# Patient Record
Sex: Female | Born: 1990 | Hispanic: Yes | Marital: Single | Smoking: Current every day smoker
Health system: Southern US, Community
[De-identification: ages and names within clinical notes are randomized; demographics above are authoritative.]

## PROBLEM LIST (undated history)

## (undated) HISTORY — PX: TONSILLECTOMY: SUR1361

## (undated) HISTORY — PX: WISDOM TOOTH EXTRACTION: SHX21

---

## 2016-12-18 ENCOUNTER — Encounter (HOSPITAL_COMMUNITY): Payer: Self-pay

## 2016-12-18 DIAGNOSIS — Z23 Encounter for immunization: Secondary | ICD-10-CM | POA: Diagnosis not present

## 2016-12-18 DIAGNOSIS — S60131A Contusion of right middle finger with damage to nail, initial encounter: Secondary | ICD-10-CM | POA: Insufficient documentation

## 2016-12-18 DIAGNOSIS — Y929 Unspecified place or not applicable: Secondary | ICD-10-CM | POA: Insufficient documentation

## 2016-12-18 DIAGNOSIS — Y998 Other external cause status: Secondary | ICD-10-CM | POA: Insufficient documentation

## 2016-12-18 DIAGNOSIS — Y939 Activity, unspecified: Secondary | ICD-10-CM | POA: Insufficient documentation

## 2016-12-18 DIAGNOSIS — Z79899 Other long term (current) drug therapy: Secondary | ICD-10-CM | POA: Diagnosis not present

## 2016-12-18 DIAGNOSIS — W230XXA Caught, crushed, jammed, or pinched between moving objects, initial encounter: Secondary | ICD-10-CM | POA: Diagnosis not present

## 2016-12-18 DIAGNOSIS — F17200 Nicotine dependence, unspecified, uncomplicated: Secondary | ICD-10-CM | POA: Diagnosis not present

## 2016-12-18 DIAGNOSIS — S6991XA Unspecified injury of right wrist, hand and finger(s), initial encounter: Secondary | ICD-10-CM | POA: Diagnosis present

## 2016-12-18 NOTE — ED Triage Notes (Signed)
Pt presents with c/o right middle finger injury. Pt reports she slammed her right middle finger in the car door tonight. Pt reports that the nail of her fingernail is displaced and going into her finger. Pt has it wrapped at this time.

## 2016-12-18 NOTE — ED Notes (Signed)
Pt slammed her finger in car door. Not bleeding on assessment

## 2016-12-19 ENCOUNTER — Emergency Department (HOSPITAL_COMMUNITY)
Admission: EM | Admit: 2016-12-19 | Discharge: 2016-12-19 | Disposition: A | Discharge status: Home / Self Care | Payer: 59 | Attending: Emergency Medicine | Admitting: Emergency Medicine

## 2016-12-19 ENCOUNTER — Encounter (HOSPITAL_COMMUNITY): Payer: Self-pay | Admitting: Emergency Medicine

## 2016-12-19 ENCOUNTER — Emergency Department (HOSPITAL_COMMUNITY): Payer: 59

## 2016-12-19 DIAGNOSIS — S60131A Contusion of right middle finger with damage to nail, initial encounter: Secondary | ICD-10-CM

## 2016-12-19 MED ORDER — IBUPROFEN 800 MG PO TABS
800.0000 mg | ORAL_TABLET | Freq: Once | ORAL | Status: AC
  Administered 2016-12-19: 800 mg via ORAL
  Filled 2016-12-19: qty 1

## 2016-12-19 MED ORDER — ACETAMINOPHEN 500 MG PO TABS
1000.0000 mg | ORAL_TABLET | Freq: Once | ORAL | Status: AC
  Administered 2016-12-19: 1000 mg via ORAL
  Filled 2016-12-19: qty 2

## 2016-12-19 MED ORDER — TETANUS-DIPHTH-ACELL PERTUSSIS 5-2.5-18.5 LF-MCG/0.5 IM SUSP
0.5000 mL | Freq: Once | INTRAMUSCULAR | Status: AC
  Administered 2016-12-19: 0.5 mL via INTRAMUSCULAR
  Filled 2016-12-19: qty 0.5

## 2016-12-19 MED ORDER — LIDOCAINE VISCOUS 2 % MT SOLN
15.0000 mL | Freq: Once | OROMUCOSAL | Status: AC
  Administered 2016-12-19: 15 mL via OROMUCOSAL
  Filled 2016-12-19: qty 15

## 2016-12-19 NOTE — ED Provider Notes (Signed)
Pt of Dr Nicanor Alcon. I assisted with finger nail injury.   Examination of right middle finger reveals nail is actually intact but the distal corner is tucked under skin.  Finger tip was soaked in lidocaine. I replaced in correct position using tweezers. Steri strips used to keep secure and bulky dressing applied by RN prior to d/c. Discussed wound care. Tdap updated. Work note given.   ED treatment and discharge plan was discussed with supervising physician who also evaluated the patient and is agreeable with plan.    Liberty Handy, PA-C 12/19/16 0325    Nicanor Alcon, April, MD 12/19/16 [phone]

## 2016-12-19 NOTE — Discharge Instructions (Signed)
You have a contusion of the tip of your finger. Your finger nail was embedded into the corner of your finger skin.   X-ray showed to fracture.  Keep finger clean. Steri strips were used to keep nail in place. If these fall off just make sure the nail is growing normally and does not get under the skin again.   Watch for signs of infection including redness, swelling, warmth, yellow discharge, fevers.

## 2016-12-19 NOTE — ED Provider Notes (Signed)
[facility]-EMERGENCY DEPT Provider Note   CSN: [id number] Arrival date & time: 12/18/16  2053     History   Chief Complaint Chief Complaint  Patient presents with  . Finger Injury    HPI Jean Allison is a 26 y.o. female.  The history is provided by the patient.  Extremity Pain  This is a new problem. The current episode started 6 to 12 hours ago. The problem occurs constantly. The problem has not changed since onset.Pertinent negatives include no chest pain, no abdominal pain, no headaches and no shortness of breath. Nothing aggravates the symptoms. Nothing relieves the symptoms. She has tried nothing for the symptoms. The treatment provided no relief.  closed tip of the right index finger in the car door and tore nail.    History reviewed. No pertinent past medical history.  There are no active problems to display for this patient.   Past Surgical History:  Procedure Laterality Date  . TONSILLECTOMY    . WISDOM TOOTH EXTRACTION      OB History    No data available       Home Medications    Prior to Admission medications   Medication Sig Start Date End Date Taking? Authorizing Provider  albuterol (PROVENTIL HFA;VENTOLIN HFA) 108 (90 Base) MCG/ACT inhaler Inhale 2 puffs into the lungs every 6 (six) hours as needed for wheezing or shortness of breath.   Yes [provider]  ARIPiprazole (ABILIFY) 5 MG tablet Take 5 mg by mouth daily.   Yes [provider]  gabapentin (NEURONTIN) 300 MG capsule Take 300 mg by mouth 2 (two) times daily. Pt takes bid and then will take as needed up to 4 times a day. For Anxiety   Yes [provider]  hydroxypropyl methylcellulose / hypromellose (ISOPTO TEARS / GONIOVISC) 2.5 % ophthalmic solution Place 1 drop into both eyes 3 (three) times daily as needed for dry eyes.   Yes [provider]  lamoTRIgine (LAMICTAL) 100 MG tablet Take 100 mg by mouth daily.   Yes [provider]    Family  History History reviewed. No pertinent family history.  Social History Social History  Substance Use Topics  . Smoking status: Current Every Day Smoker  . Smokeless tobacco: Never Used  . Alcohol use Yes     Allergies   Patient has no known allergies.   Review of Systems Review of Systems  Respiratory: Negative for shortness of breath.   Cardiovascular: Negative for chest pain.  Gastrointestinal: Negative for abdominal pain.  Musculoskeletal: Positive for arthralgias. Negative for back pain and neck stiffness.  Skin: Negative for color change and wound.  Neurological: Negative for headaches.  All other systems reviewed and are negative.    Physical Exam Updated Vital Signs BP (!) 92/56 (BP Location: Left Arm)   Pulse 83   Temp 98.4 F (36.9 C) (Oral)   Resp 16   Ht 5' (1.524 m)   Wt 45.4 kg (100 lb 1.6 oz)   LMP 12/18/2016 (Approximate)   SpO2 100%   BMI 19.55 kg/m   Physical Exam  Constitutional: She is oriented to person, place, and time. She appears well-developed and well-nourished. No distress.  HENT:  Head: Normocephalic and atraumatic.  Nose: Nose normal.  Eyes: Conjunctivae and EOM are normal.  Neck: Normal range of motion. Neck supple.  Cardiovascular: Normal rate, regular rhythm, normal heart sounds and intact distal pulses.   Pulmonary/Chest: Effort normal and breath sounds normal. She has no wheezes. She  has no rales.  Abdominal: Soft. Bowel sounds are normal. She exhibits no mass. There is no tenderness. There is no rebound and no guarding.  Musculoskeletal: She exhibits no deformity.       Right hand: Normal sensation noted. Normal strength noted.  Neurological: She is alert and oriented to person, place, and time.  Skin: Skin is warm and dry. Capillary refill takes less than 2 seconds.  Psychiatric: She has a normal mood and affect.     ED Treatments / Results   Vitals:   12/18/16 2123  BP: (!) 92/56  Pulse: 83  Resp: 16  Temp: 98.4 F  (36.9 C)  SpO2: 100%    Radiology Dg Hand Complete Right  Result Date: 12/19/2016 CLINICAL DATA:  Right hand pain after injury, closed hand in car door. EXAM: RIGHT HAND - COMPLETE 3+ VIEW COMPARISON:  None. FINDINGS: There is no evidence of fracture or dislocation. There is no evidence of arthropathy or other focal bone abnormality. Possible soft tissue injury to the distal third digit, no radiopaque foreign bodies. IMPRESSION: Possible soft tissue injury to the distal third digit. No osseous abnormality. Electronically Signed   By: Rubye Oaks M.D.   On: 12/19/2016 01:50    Procedures Procedures (including critical care time)  Medications Ordered in ED Medications  lidocaine (XYLOCAINE) 2 % viscous mouth solution 15 mL (not administered)  acetaminophen (TYLENOL) tablet 1,000 mg (1,000 mg Oral Given 12/19/16 0128)  ibuprofen (ADVIL,MOTRIN) tablet 800 mg (800 mg Oral Given 12/19/16 0128)  Tdap (BOOSTRIX) injection 0.5 mL (0.5 mLs Intramuscular Given 12/19/16 0128)     Piece of nail lifted off by claudia  Final Clinical Impressions(s) / ED Diagnoses  Keep clean and dry and covered.  Tetanus updated.   The patient is very well appearing and has been observed in the ED.  Strict return precautions given for  intractable rash, swelling or the lips tongue or floor of the mouth, chest pain, dyspnea on exertion, new weakness or numbness changes in vision or speech,  Inability to tolerate liquids or food, fevers > 101, rashes on the skin, changes in voice cough, altered mental status or any concerns. No signs of systemic illness or infection. The patient is nontoxic-appearing on exam and vital signs are within normal limits.    I have reviewed the triage vital signs and the nursing notes. Pertinent labs &imaging results that were available during my care of the patient were reviewed by me and considered in my medical decision making (see chart for details).  After history, exam, and  medical workup I feel the patient has been appropriately medically screened and is safe for discharge home. Pertinent diagnoses were discussed with the patient. Patient was given return precautions.    Tysen Roesler, MD 12/19/16 5883

## 2019-01-23 IMAGING — CR DG HAND COMPLETE 3+V*R*
3 series · 3 of 3 positions shown · non-contrast
Comparison: None.

CLINICAL DATA: Right hand pain after injury, closed hand in car
door.

EXAM:
RIGHT HAND - COMPLETE 3+ VIEW

[x hand pa right]
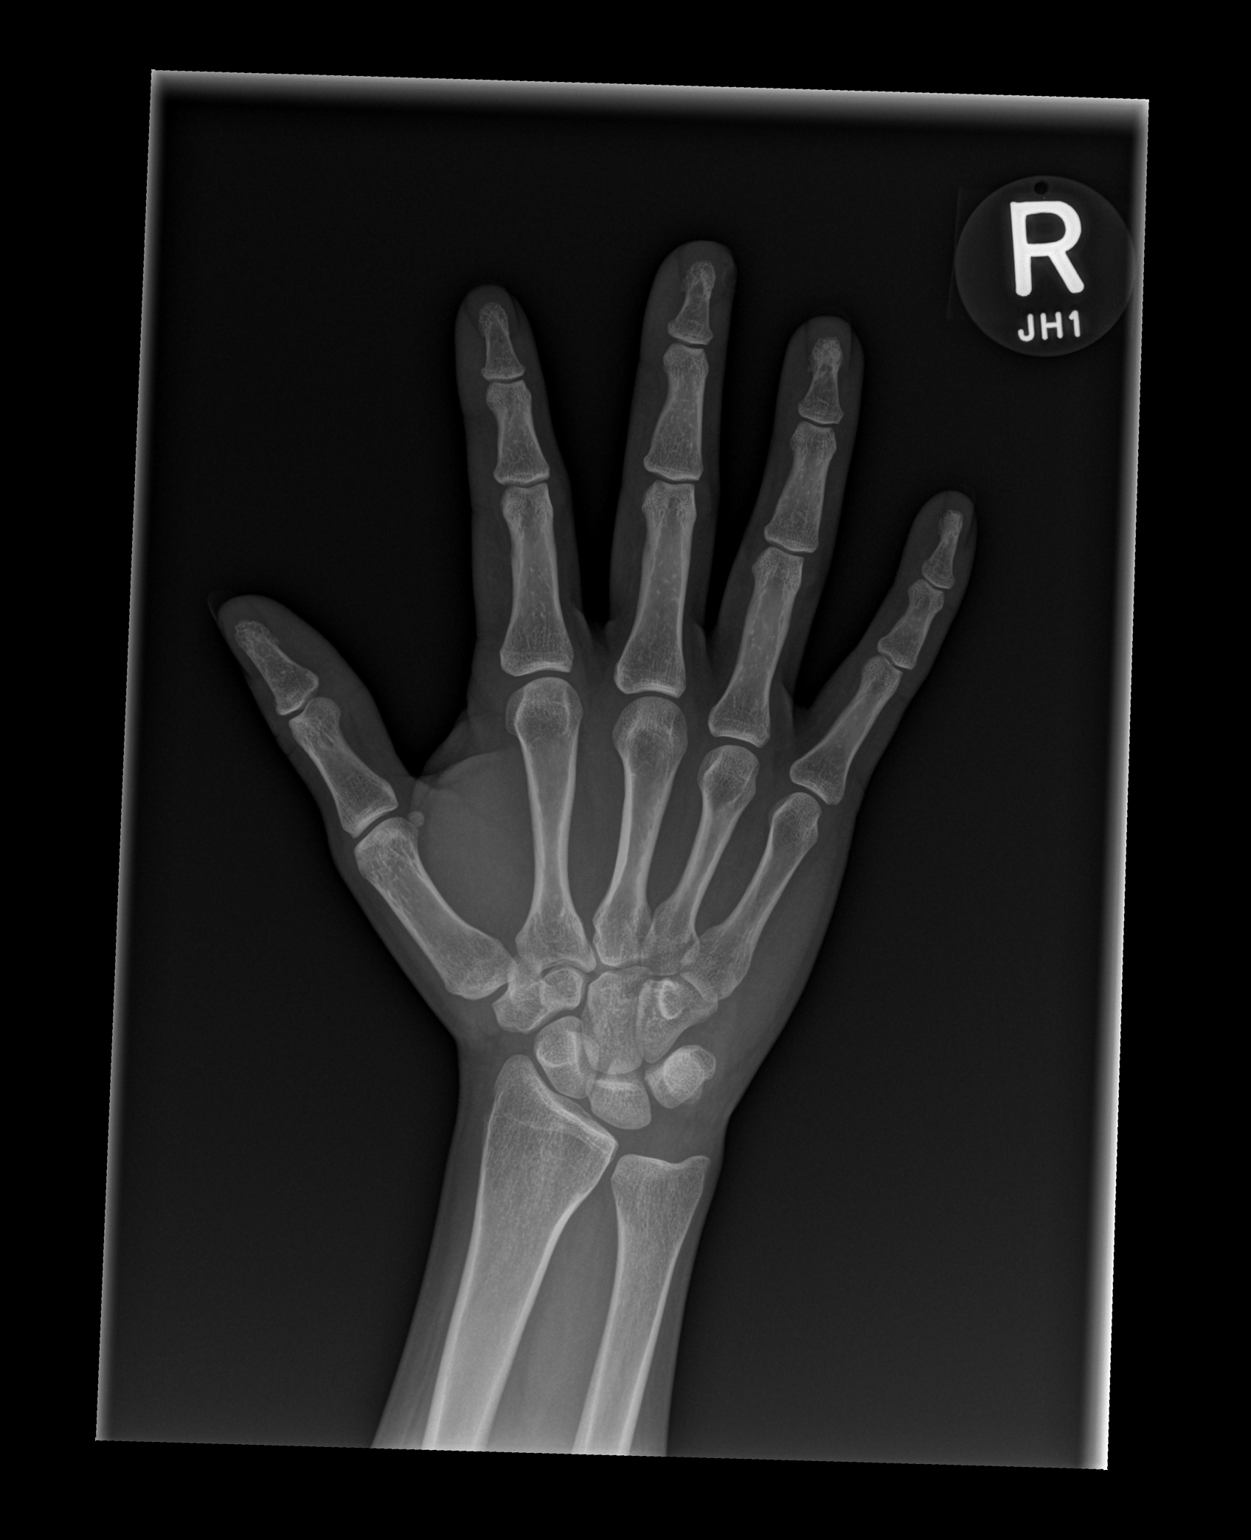

[x hand obl right]
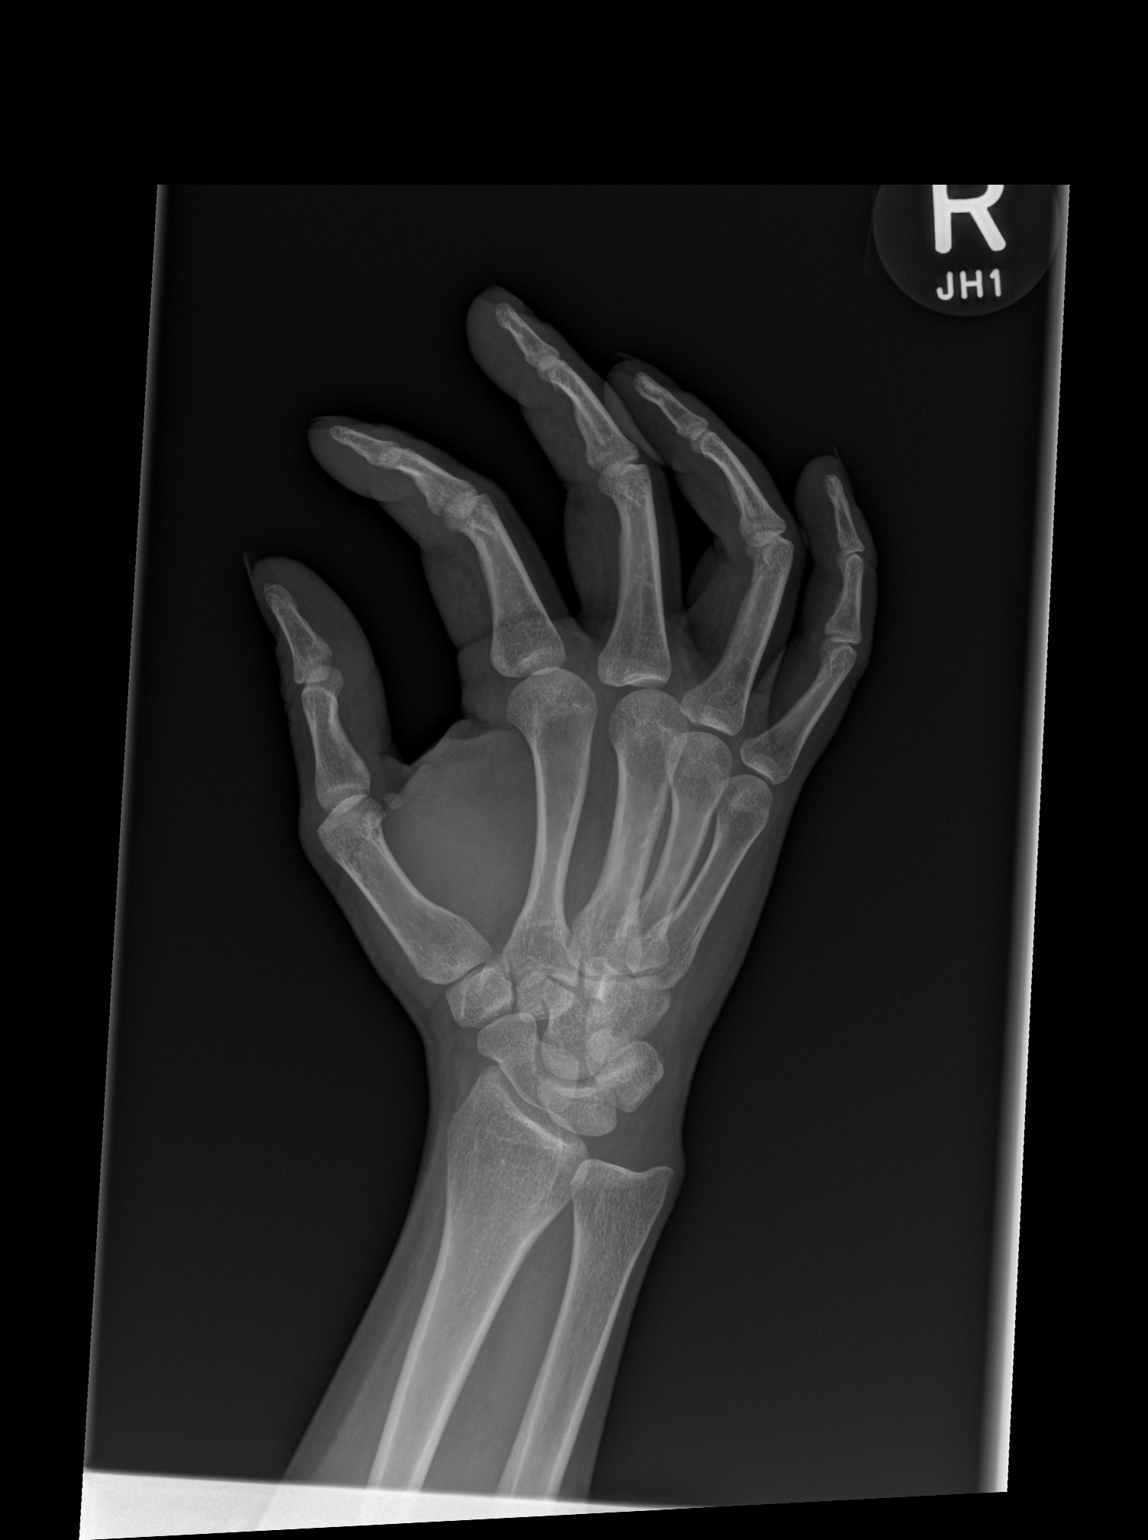

[x hand lat right]
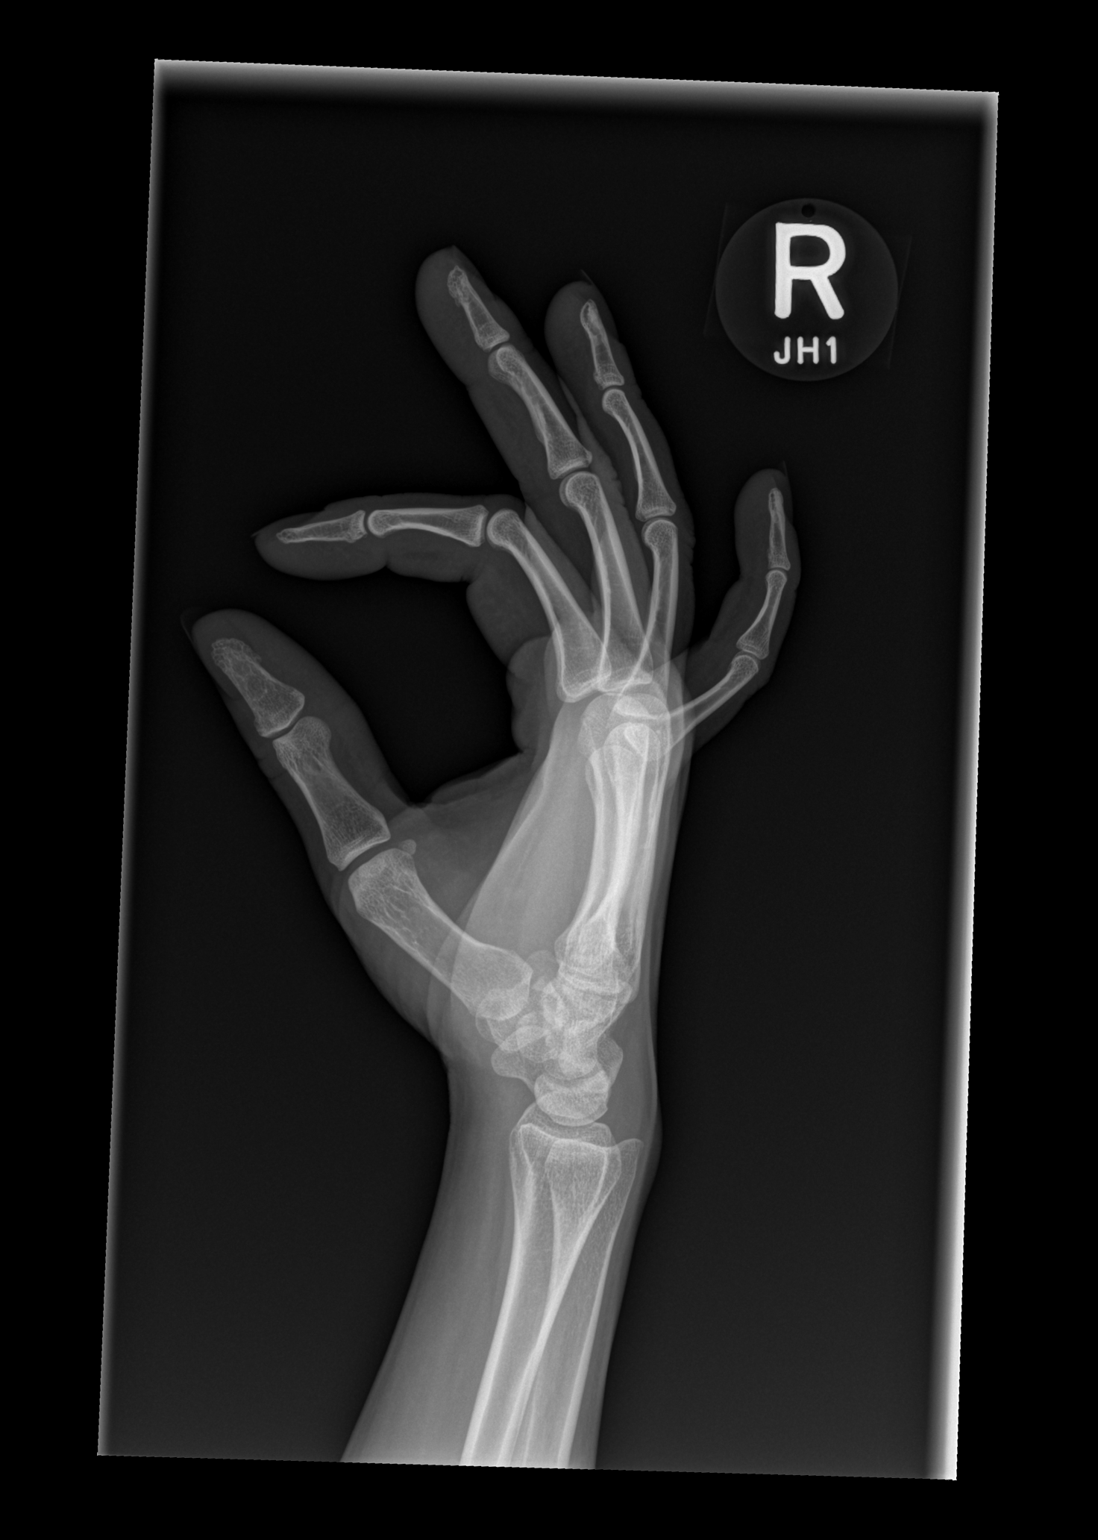

[3 of 3 positions shown; findings below may reference images not displayed]

FINDINGS: There is no evidence of fracture or dislocation. There is no
evidence of arthropathy or other focal bone abnormality. Possible
soft tissue injury to the distal third digit, no radiopaque foreign
bodies.
IMPRESSION: Possible soft tissue injury to the distal third digit. No osseous
abnormality.
